# Patient Record
Sex: Male | Born: 2005 | Race: White | Hispanic: No | Marital: Single | Smoking: Never smoker
Health system: Southern US, Community
[De-identification: ages and names within clinical notes are randomized; demographics above are authoritative.]

## PROBLEM LIST (undated history)

## (undated) DIAGNOSIS — J4550 Severe persistent asthma, uncomplicated: Secondary | ICD-10-CM

## (undated) DIAGNOSIS — F902 Attention-deficit hyperactivity disorder, combined type: Secondary | ICD-10-CM

## (undated) DIAGNOSIS — F70 Mild intellectual disabilities: Secondary | ICD-10-CM

## (undated) HISTORY — DX: Severe persistent asthma, uncomplicated: J45.50

## (undated) HISTORY — DX: Mild intellectual disabilities: F70

## (undated) HISTORY — DX: Attention-deficit hyperactivity disorder, combined type: F90.2

---
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
## 2023-01-21 ENCOUNTER — Encounter: Payer: Self-pay | Admitting: *Deleted
